# Patient Record
Sex: Male | Born: 1975 | Race: White | Hispanic: No | Marital: Married | Smoking: Never smoker
Health system: Southern US, Community
[De-identification: ages and names within clinical notes are randomized; demographics above are authoritative.]

---

## 2000-07-27 ENCOUNTER — Other Ambulatory Visit: Admission: RE | Admit: 2000-07-27 | Discharge: 2000-07-27 | Payer: Self-pay | Admitting: Specialist

## 2001-01-31 ENCOUNTER — Emergency Department (HOSPITAL_COMMUNITY): Admission: EM | Admit: 2001-01-31 | Discharge: 2001-01-31 | Payer: Self-pay | Admitting: Emergency Medicine

## 2001-01-31 ENCOUNTER — Encounter: Payer: Self-pay | Admitting: Emergency Medicine

## 2004-10-22 ENCOUNTER — Ambulatory Visit (HOSPITAL_COMMUNITY): Admission: RE | Admit: 2004-10-22 | Discharge: 2004-10-22 | Payer: Self-pay | Admitting: Family Medicine

## 2006-03-23 ENCOUNTER — Ambulatory Visit (HOSPITAL_COMMUNITY): Admission: RE | Admit: 2006-03-23 | Discharge: 2006-03-23 | Payer: Self-pay | Admitting: Specialist

## 2006-12-22 ENCOUNTER — Encounter: Admission: RE | Admit: 2006-12-22 | Discharge: 2006-12-22 | Payer: Self-pay | Admitting: Specialist

## 2008-03-03 IMAGING — CT CT 3D INDEPENDENT WKST
3 series · 15 of 27 positions shown, 18 images · non-contrast
Comparison: none

CLINICAL DATA: ?loose body
 3-DIMENSIONAL CT IMAGE RENDERING ON INDEPENDENT WORK STATION:
 3-dimensional CT images were rendered by post-processing of the original CT data on an independent work station.  The 3-dimensional CT images were interpreted, and findings were reported in the accompanying complete CT report for this study.

[Series 2: bone windows · axial · 0.39mm/px · z∈[-48,+42]mm · 5 of 109 slices shown, 7 images]
[im 19/109  soft-tissue]
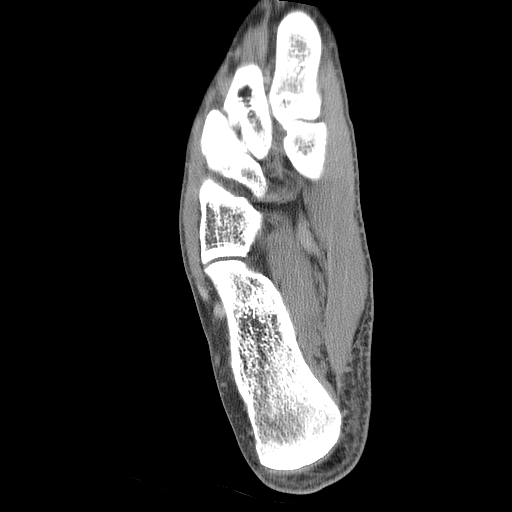
[im 19/109  bone]
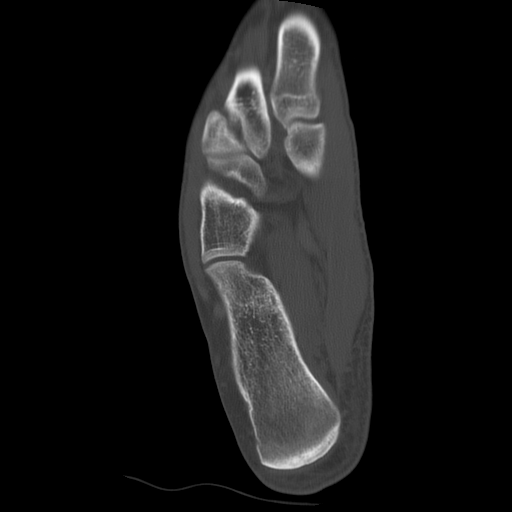
[im 37/109  bone]
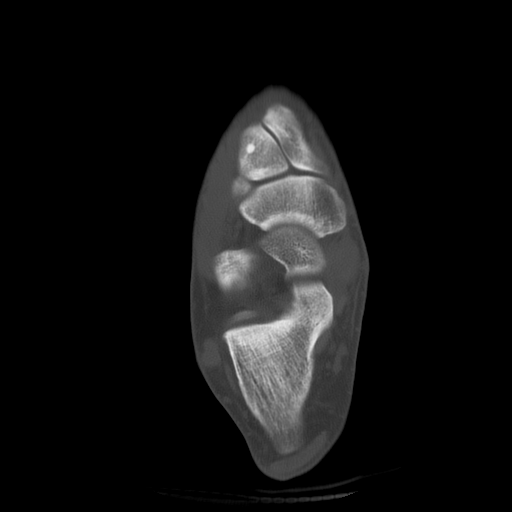
[im 55/109  bone]
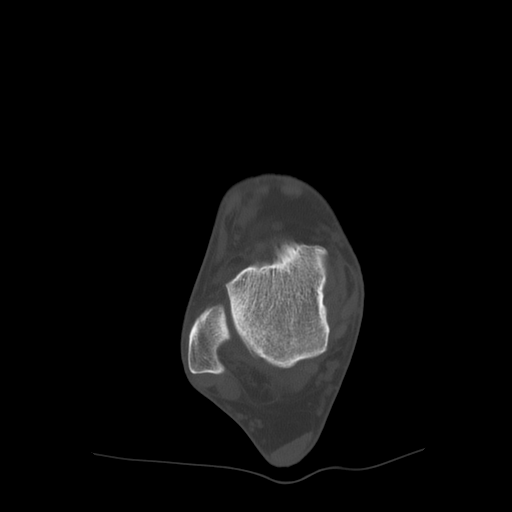
[im 73/109  bone]
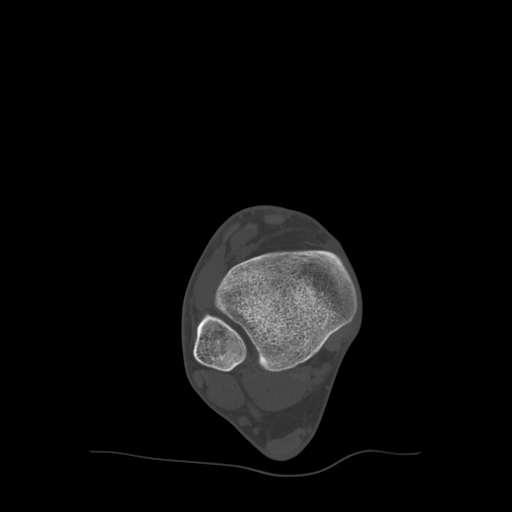
[im 91/109  soft-tissue]
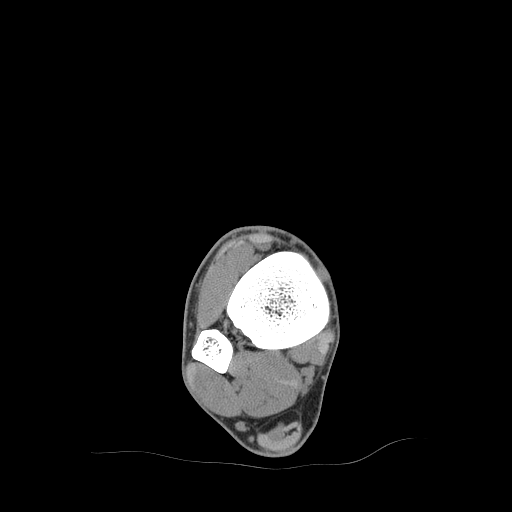
[im 91/109  bone]
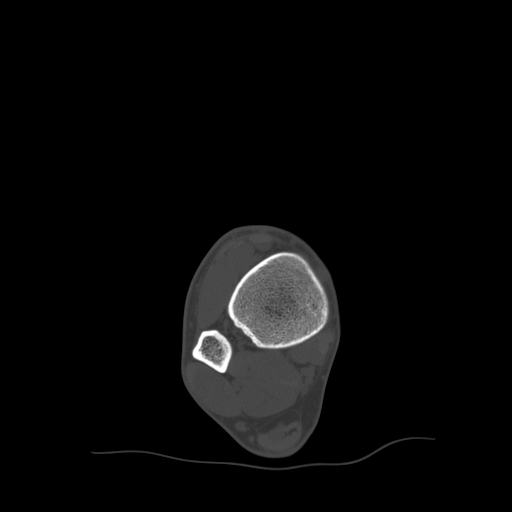

[Series 3: detail windows · axial · 0.39mm/px · z∈[-48,+42]mm · 5 of 109 slices shown]
[im 19/109  bone]
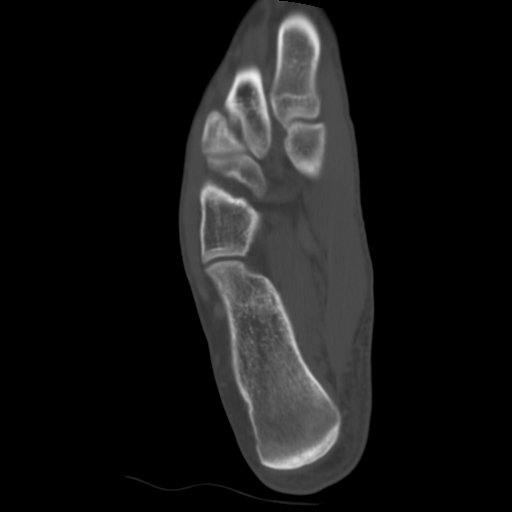
[im 37/109  bone]
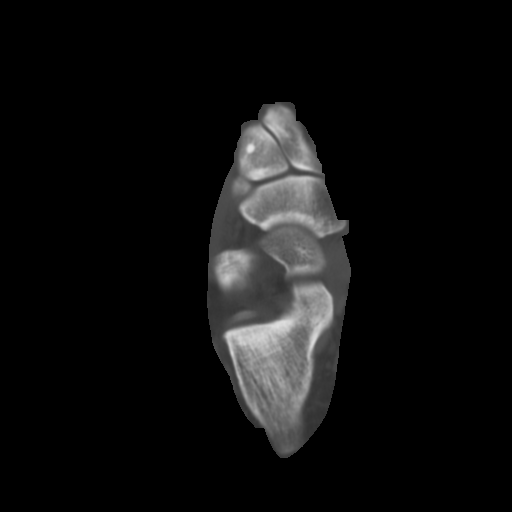
[im 55/109  bone]
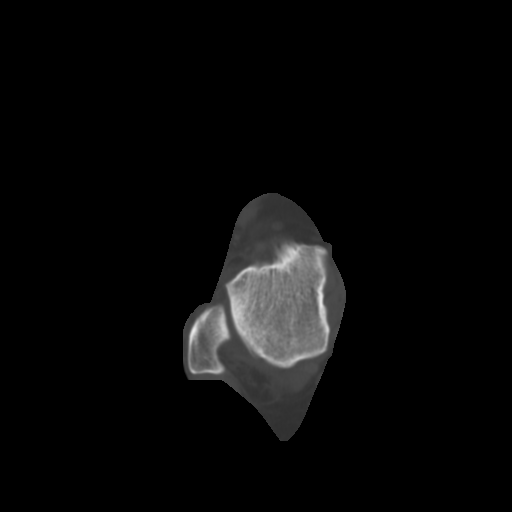
[im 73/109  bone]
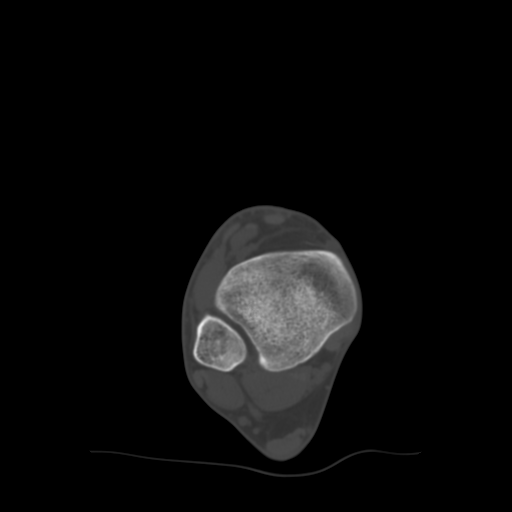
[im 91/109  bone]
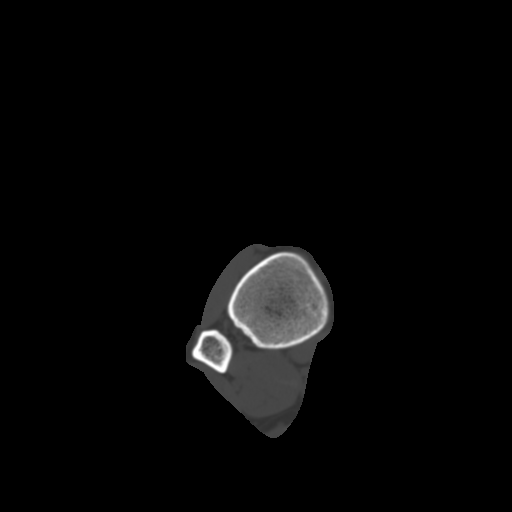

[Series 401: coronal · coronal · 0.39mm/px · 5 of 60 slices shown, 6 images]
[im 20/60  bone]
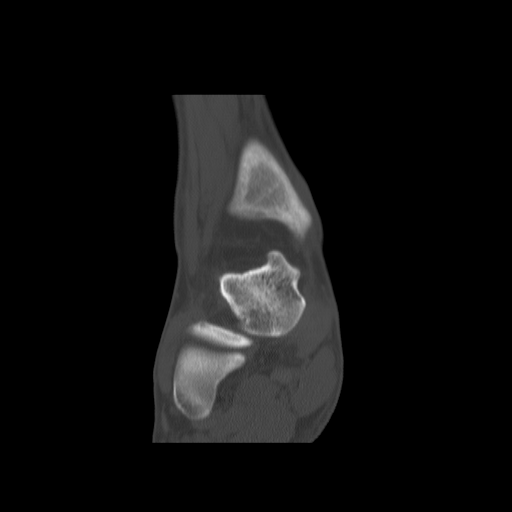
[im 25/60  bone]
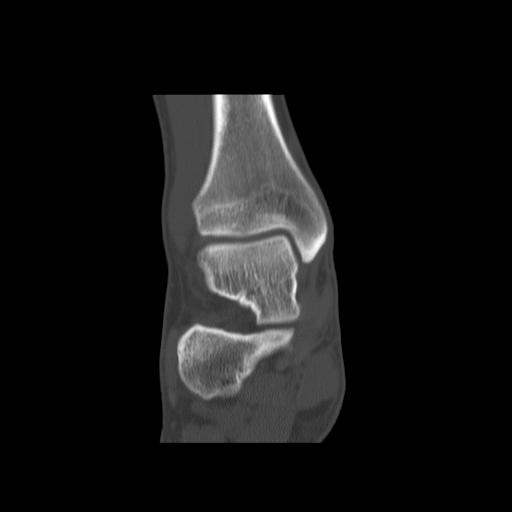
[im 30/60  soft-tissue]
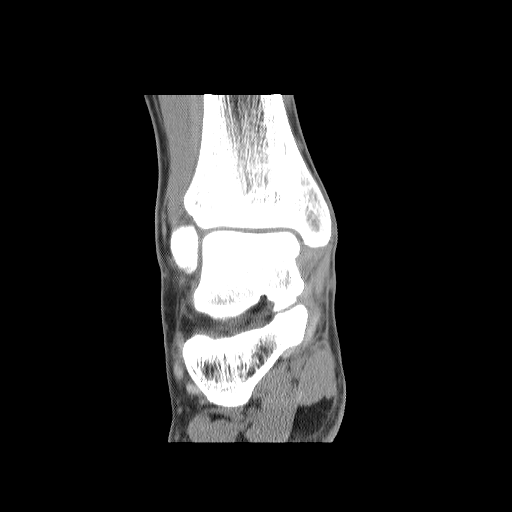
[im 30/60  bone]
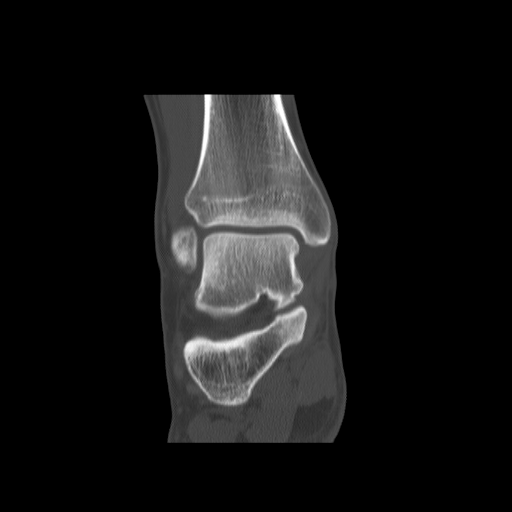
[im 35/60  bone]
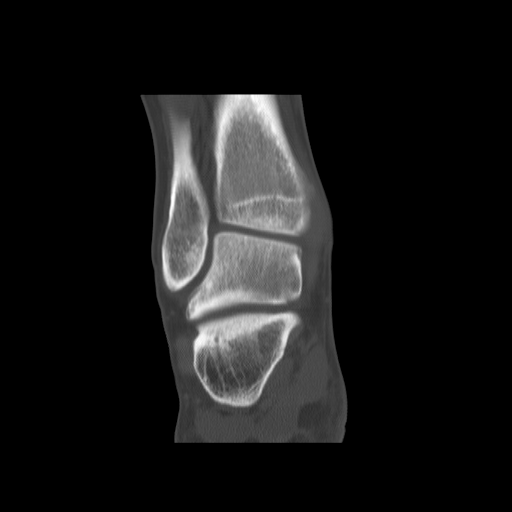
[im 40/60  bone]
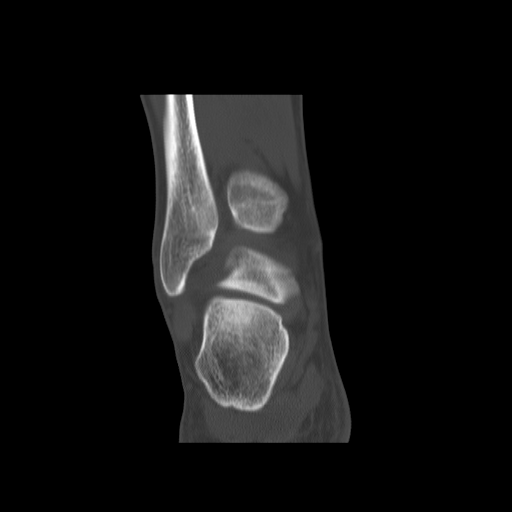

[15 of 27 positions shown; findings below may reference images not displayed]

## 2008-12-17 ENCOUNTER — Ambulatory Visit (HOSPITAL_COMMUNITY): Admission: RE | Admit: 2008-12-17 | Discharge: 2008-12-17 | Payer: Self-pay | Admitting: Orthopedic Surgery

## 2009-08-10 ENCOUNTER — Observation Stay (HOSPITAL_COMMUNITY): Admission: EM | Admit: 2009-08-10 | Discharge: 2009-08-11 | Payer: Self-pay | Admitting: Orthopedic Surgery

## 2016-07-02 ENCOUNTER — Ambulatory Visit (INDEPENDENT_AMBULATORY_CARE_PROVIDER_SITE_OTHER): Payer: BC Managed Care – PPO | Admitting: Family Medicine

## 2016-07-02 ENCOUNTER — Encounter: Payer: Self-pay | Admitting: Family Medicine

## 2016-07-02 VITALS — BP 120/80 | Temp 98.3°F | Ht 73.0 in | Wt 207.1 lb

## 2016-07-02 DIAGNOSIS — R42 Dizziness and giddiness: Secondary | ICD-10-CM

## 2016-07-02 DIAGNOSIS — J329 Chronic sinusitis, unspecified: Secondary | ICD-10-CM | POA: Diagnosis not present

## 2016-07-02 MED ORDER — CEPHALEXIN 500 MG PO CAPS
500.0000 mg | ORAL_CAPSULE | Freq: Three times a day (TID) | ORAL | 0 refills | Status: DC
Start: 2016-07-02 — End: 2016-10-26

## 2016-07-02 MED ORDER — MECLIZINE HCL 25 MG PO TABS: 25.0000 mg | ORAL_TABLET | Freq: Three times a day (TID) | ORAL | 0 refills | Status: DC | PRN

## 2016-07-02 NOTE — Progress Notes (Signed)
   Subjective:    Patient ID: Cory PickettWilliam E Howden, male    DOB: [birth year], 41 y.o.   MRN: [id number]  Dizziness  This is a new problem. The current episode started in the past 7 days. The problem occurs intermittently. The problem has been unchanged. Nothing aggravates the symptoms. He has tried nothing for the symptoms. The treatment provided no relief.   Patient has no other concerns at this time.    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       Objective:   Physical Exam Alert active mild nasal congestion HEENT normal facial neck supple cerebellar function perfect lungs clear heart regular in rhythm. Dizziness transiently reproduce with head and neck motion       Assessment & Plan:  Impression vertigo/in her ear dysfunction clinically initiated when odd head position at work. Also with recent upper rest for congestion will cover for potential sinusitis component plan Antivert 25 3 times a day 10 days antibiotics prescribed. Symptom care discussed if persists may need further workup and/or intervention such as Apley's maneuver training discussed

## 2016-07-10 ENCOUNTER — Ambulatory Visit (INDEPENDENT_AMBULATORY_CARE_PROVIDER_SITE_OTHER): Payer: BC Managed Care – PPO | Admitting: Family Medicine

## 2016-07-10 ENCOUNTER — Encounter: Payer: Self-pay | Admitting: Family Medicine

## 2016-07-10 VITALS — BP 118/76 | Ht 73.0 in | Wt 206.1 lb

## 2016-07-10 DIAGNOSIS — K2970 Gastritis, unspecified, without bleeding: Secondary | ICD-10-CM

## 2016-07-10 DIAGNOSIS — K209 Esophagitis, unspecified without bleeding: Secondary | ICD-10-CM

## 2016-07-10 MED ORDER — PANTOPRAZOLE SODIUM 40 MG PO TBEC: 40.0000 mg | DELAYED_RELEASE_TABLET | Freq: Every day | ORAL | 1 refills | Status: DC

## 2016-07-10 MED ORDER — SUCRALFATE 1 G PO TABS: ORAL_TABLET | ORAL | 0 refills | Status: DC

## 2016-07-10 NOTE — Progress Notes (Signed)
   Subjective:    Patient ID: Cory PickettWilliam E Brege, male    DOB: July 30, 1975, 41 y.o.   MRN: [id number]  HPI Patient in today with c/o of pain in chest after swallowing food. Also has c/o hiccups.   Wed ate lunch  Feels like a knife in the chest  Got to hurting and then went away  Had b fast   yest painful  Burping a lot  hiccuping frequently  No heartburn  Some coffee one or two cups  No nicotine  Rare use anti inflam meds  Alcohol intake , rare beer every couple weeks four times per wk, not heavy, ocas incr in holdiays hits very hard transienty in mid chet then calms down,  notmal bms   States no other concerns this visit.   Review of Systems No headache, no major weight loss or weight gain, no chest pain no back pain abdominal pain no change in bowel habits complete ROS otherwise negative     Objective:   Physical Exam Alert vitals stable, NAD. Blood pressure good on repeat. HEENT normal. Lungs clear. Heart regular rate and rhythm., Carotid pulses normal. No murmurs Abdomen mild to moderate epigastric tenderness. No rebound no guarding no organomegaly bowel sounds within normal limits       Assessment & Plan:  Impression #1 gastritis/esophagitis with an element of food causing distal transient esophageal spasm of just a few seconds, near dysphasia though not true dysphasia yet discussed with patient plan H. pylori. Initiate protonic. Add Carafate before meals to cover any element of esophagitis. Cut back on spicy foods caffeine. Warning signs discussed. If this resolves symptom wise no need for follow-up of persists call back for further interventions discussed

## 2016-07-10 NOTE — Patient Instructions (Signed)
Esophagitis °Esophagitis is inflammation of the esophagus. The esophagus is the tube that carries food and liquids from your mouth to your stomach. Esophagitis can cause soreness or pain in the esophagus. This condition can make it difficult and painful to swallow. °What are the causes? °Most causes of esophagitis are not serious. Common causes of this condition include: °· Gastroesophageal reflux disease (GERD). This is when stomach contents move back up into the esophagus (reflux). °· Repeated vomiting. °· An allergic-type reaction, especially caused by food allergies (eosinophilic esophagitis). °· Injury to the esophagus by swallowing large pills with or without water, or swallowing certain types of medicines. °· Swallowing (ingesting) harmful chemicals, such as household cleaning products. °· Heavy alcohol use. °· An infection of the esophagus. This most often occurs in people who have a weakened immune system. °· Radiation or chemotherapy treatment for cancer. °· Certain diseases such as sarcoidosis, Crohn disease, and scleroderma. °What are the signs or symptoms? °Symptoms of this condition include: °· Difficult or painful swallowing. °· Pain with swallowing acidic liquids, such as citrus juices. °· Pain with burping. °· Chest pain. °· Difficulty breathing. °· Nausea. °· Vomiting. °· Pain in the abdomen. °· Weight loss. °· Ulcers in the mouth. °· Patches of white material in the mouth (candidiasis). °· Fever. °· Coughing up blood or vomiting blood. °· Stool that is black, tarry, or bright red. °How is this diagnosed? °Your health care provider will take a medical history and perform a physical exam. You may also have other tests, including: °· An endoscopy to examine your stomach and esophagus with a small camera. °· A test that measures the acidity level in your esophagus. °· A test that measures how much pressure is on your esophagus. °· A barium swallow or modified barium swallow to show the shape, size,  and functioning of your esophagus. °· Allergy tests. °How is this treated? °Treatment for this condition depends on the cause of your esophagitis. In some cases, steroids or other medicines may be given to help relieve your symptoms or to treat the underlying cause of your condition. You may have to make some lifestyle changes, such as: °· Avoiding alcohol. °· Quitting smoking. °· Changing your diet. °· Exercising. °· Changing your sleep habits and your sleep environment. °Follow these instructions at home: °Take these actions to decrease your discomfort and to help avoid complications. °Diet  °· Follow a diet as recommended by your health care provider. This may involve avoiding foods and drinks such as: °¨ Coffee and tea (with or without caffeine). °¨ Drinks that contain alcohol. °¨ Energy drinks and sports drinks. °¨ Carbonated drinks or sodas. °¨ Chocolate and cocoa. °¨ Peppermint and mint flavorings. °¨ Garlic and onions. °¨ Horseradish. °¨ Spicy and acidic foods, including peppers, chili powder, curry powder, vinegar, hot sauces, and barbecue sauce. °¨ Citrus fruit juices and citrus fruits, such as oranges, lemons, and limes. °¨ Tomato-based foods, such as red sauce, chili, salsa, and pizza with red sauce. °¨ Fried and fatty foods, such as donuts, french fries, potato chips, and high-fat dressings. °¨ High-fat meats, such as hot dogs and fatty cuts of red and white meats, such as rib eye steak, sausage, ham, and bacon. °¨ High-fat dairy items, such as whole milk, butter, and cream cheese. °· Eat small, frequent meals instead of large meals. °· Avoid drinking large amounts of liquid with your meals. °· Avoid eating meals during the 2-3 hours before bedtime. °· Avoid lying down right after you eat. °·   Do not exercise right after you eat. °· Avoid foods and drinks that seem to make your symptoms worse. °General instructions  °· Pay attention to any changes in your symptoms. °· Take over-the-counter and  prescription medicines only as told by your health care provider. Do not take aspirin, ibuprofen, or other NSAIDs unless your health care provider told you to do so. °· If you have trouble taking pills, use a pill splitter to decrease the size of the pill. This will decrease the chance of the pill getting stuck or injuring your esophagus on the way down. Also, drink water after you take a pill. °· Do not use any tobacco products, including cigarettes, chewing tobacco, and e-cigarettes. If you need help quitting, ask your health care provider. °· Wear loose-fitting clothing. Do not wear anything tight around your waist that causes pressure on your abdomen. °· Raise (elevate) the head of your bed about 6 inches (15 cm). °· Try to reduce your stress, such as with yoga or meditation. If you need help reducing stress, ask your health care provider. °· If you are overweight, reduce your weight to an amount that is healthy for you. Ask your health care provider for guidance about a safe weight loss goal. °· Keep all follow-up visits as told by your health care provider. This is important. °Contact a health care provider if: °· You have new symptoms. °· You have unexplained weight loss. °· You have difficulty swallowing, or it hurts to swallow. °· You have wheezing or a persistent cough. °· Your symptoms do not improve with treatment. °· You have frequent heartburn for more than two weeks. °Get help right away if: °· You have severe pain in your arms, neck, jaw, teeth, or back. °· You feel sweaty, dizzy, or light-headed. °· You have chest pain or shortness of breath. °· You vomit and your vomit looks like blood or coffee grounds. °· Your stool is bloody or black. °· You have a fever. °· You cannot swallow, drink, or eat. °This information is not intended to replace advice given to you by your health care provider. Make sure you discuss any questions you have with your health care provider. °Document Released: 06/04/2004  Document Revised: 10/03/2015 Document Reviewed: 08/22/2014 °Elsevier Interactive Patient Education © 2017 [employer] ° °

## 2016-07-14 LAB — H PYLORI, IGM, IGG, IGA AB: H. pylori, IgG AbS: <0.8 Index Value (ref 0.00–0.79)

## 2016-10-26 ENCOUNTER — Encounter: Payer: BC Managed Care – PPO | Admitting: Family Medicine

## 2016-10-26 ENCOUNTER — Ambulatory Visit (INDEPENDENT_AMBULATORY_CARE_PROVIDER_SITE_OTHER): Payer: BC Managed Care – PPO | Admitting: Family Medicine

## 2016-10-26 ENCOUNTER — Encounter: Payer: Self-pay | Admitting: Family Medicine

## 2016-10-26 VITALS — BP 114/76 | HR 86 | Ht 72.0 in | Wt 196.0 lb

## 2016-10-26 DIAGNOSIS — Z0000 Encounter for general adult medical examination without abnormal findings: Secondary | ICD-10-CM

## 2016-10-26 NOTE — Progress Notes (Signed)
   Subjective:    Patient ID: Cory Arnold, male    DOB: March 23, 1976, 41 y.o.   MRN: [id number]  HPI The patient comes in today for a wellness visit.     A review of their health history was completed.  A review of medications was also completed.  Any needed refills; N/A   Eating habits: Patient states eating habits are good. Makes healthy choices.   Falls/  MVA accidents in past few months: None   Regular exercise: Patient coaches baseball.   Specialist pt sees on regular basis: None  Preventative health issues were discussed.   Additional concerns: Patient states no concerns this visit.   Had gastritis, had mild flare and tre took meds, and helped   vertigo has calmed down     Review of Systems  Constitutional: Negative for activity change, appetite change and fever.  HENT: Negative for congestion and rhinorrhea.   Eyes: Negative for discharge.  Respiratory: Negative for cough and wheezing.   Cardiovascular: Negative for chest pain.  Gastrointestinal: Negative for abdominal pain, blood in stool and vomiting.  Genitourinary: Negative for difficulty urinating and frequency.  Musculoskeletal: Negative for neck pain.  Skin: Negative for rash.  Allergic/Immunologic: Negative for environmental allergies and food allergies.  Neurological: Negative for weakness and headaches.  Psychiatric/Behavioral: Negative for agitation.  All other systems reviewed and are negative.      Objective:   Physical Exam  Constitutional: He appears well-developed and well-nourished.  HENT:  Head: Normocephalic and atraumatic.  Right Ear: External ear normal.  Left Ear: External ear normal.  Nose: Nose normal.  Mouth/Throat: Oropharynx is clear and moist.  Eyes: EOM are normal. Pupils are equal, round, and reactive to light.  Neck: Normal range of motion. Neck supple. No thyromegaly present.  Cardiovascular: Normal rate, regular rhythm and normal heart sounds.   No murmur  heard. Pulmonary/Chest: Effort normal and breath sounds normal. No respiratory distress. He has no wheezes.  Abdominal: Soft. Bowel sounds are normal. He exhibits no distension and no mass. There is no tenderness.  Genitourinary: Penis normal.  Musculoskeletal: Normal range of motion. He exhibits no edema.  Lymphadenopathy:    He has no cervical adenopathy.  Neurological: He is alert. He exhibits normal muscle tone.  Skin: Skin is warm and dry. No erythema.  Psychiatric: He has a normal mood and affect. His behavior is normal. Judgment normal.  Vitals reviewed.         Assessment & Plan:  Impression well adult exam doing great diet exercise discussed. Plan form filled out appropriate blood work further recommendations based results

## 2017-03-01 ENCOUNTER — Encounter: Payer: Self-pay | Admitting: Family Medicine

## 2017-03-01 ENCOUNTER — Ambulatory Visit (INDEPENDENT_AMBULATORY_CARE_PROVIDER_SITE_OTHER): Payer: BC Managed Care – PPO | Admitting: Family Medicine

## 2017-03-01 VITALS — BP 112/74 | Ht 72.0 in | Wt 207.0 lb

## 2017-03-01 DIAGNOSIS — S161XXA Strain of muscle, fascia and tendon at neck level, initial encounter: Secondary | ICD-10-CM | POA: Diagnosis not present

## 2017-03-01 MED ORDER — CHLORZOXAZONE 500 MG PO TABS: ORAL_TABLET | ORAL | 1 refills | Status: DC

## 2017-03-01 MED ORDER — NABUMETONE 750 MG PO TABS: 750.0000 mg | ORAL_TABLET | Freq: Two times a day (BID) | ORAL | 1 refills | Status: DC | PRN

## 2017-03-01 NOTE — Progress Notes (Signed)
   Subjective:    Patient ID: Cory PickettWilliam E Arnold, male    DOB: [birth year], 41 y.o.   MRN: [id number]  HPINeck and right shoulder pain. Started 3 days ago. Tried tylenol. Some relief.   Declines flu vaccine.   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 Blood pressure good on repeat. HEENT normal. Lungs clear. Heart regular rate and rhythm. Positive parascapular tenderness soreness to palpation positive pain with rotation of neck arms strength intact reflexes intact    impression paracervical strain/spasm plan anti-inflammatory medicine prescribed symptom care discussed local measures discussed      Assessment & Plan:

## 2017-03-05 ENCOUNTER — Telehealth: Payer: Self-pay | Admitting: Family Medicine

## 2017-03-05 DIAGNOSIS — M542 Cervicalgia: Secondary | ICD-10-CM

## 2017-03-05 MED ORDER — PREDNISONE 20 MG PO TABS: ORAL_TABLET | ORAL | 0 refills | Status: DC

## 2017-03-05 NOTE — Telephone Encounter (Signed)
I would recommend a short course of prednisone 20 mg tablets, 3 tablets daily for 2 days then 2 tablets daily for 2 days then 1 tablet daily for 2 days hold off on Relafen, may use Tylenol with the prednisone, also recommend physical therapy consult plus also a follow-up office visit with Dr. Brett CanalesSteve if not doing better by midweek

## 2017-03-05 NOTE — Telephone Encounter (Signed)
Patient advised doctor would recommend a short course of prednisone 20 mg tablets, 3 tablets daily for 2 days then 2 tablets daily for 2 days then 1 tablet daily for 2 days hold off on Relafen, may use Tylenol with the prednisone, also recommend physical therapy consult plus also a follow-up office visit with Dr. Brett CanalesSteve if not doing better by midweek. Patient verbalized understanding . Prescription sent electronically to pharmacy.  [facility]

## 2017-03-05 NOTE — Telephone Encounter (Signed)
Patient seen Dr. Brett CanalesSteve on 03/01/17 for neck strain.  He said his neck is not getting any better and would like to know what is recommended?

## 2017-03-05 NOTE — Telephone Encounter (Signed)
Was seen 03/01/17 for neck strain and given chlorzoxazone and relafen

## 2017-03-10 ENCOUNTER — Encounter: Payer: Self-pay | Admitting: Family Medicine

## 2017-03-18 ENCOUNTER — Other Ambulatory Visit: Payer: Self-pay | Admitting: Family Medicine

## 2017-03-18 ENCOUNTER — Telehealth: Payer: Self-pay | Admitting: Family Medicine

## 2017-03-18 MED ORDER — DOXYCYCLINE HYCLATE 100 MG PO TABS: 100.0000 mg | ORAL_TABLET | Freq: Two times a day (BID) | ORAL | 0 refills | Status: DC

## 2017-03-18 NOTE — Telephone Encounter (Signed)
The patient showed me a tick bite that was on his left inside of his arm he is not having any fever chills sweats or unusual rash but the tick bite erythematous red around it with a central part that is a scab I recommended doxycycline 100 mg twice daily for 7 Days [address] to take it was discussed.  In addition to this patient was warned that if he starts having fever headaches body aches high fevers unusual rash he must be seen right away doxycycline was sent into his pharmacy

## 2017-03-18 NOTE — Progress Notes (Signed)
do

## 2017-09-09 ENCOUNTER — Ambulatory Visit: Payer: BC Managed Care – PPO | Admitting: Family Medicine

## 2017-09-09 ENCOUNTER — Encounter: Payer: Self-pay | Admitting: Family Medicine

## 2017-09-09 VITALS — BP 142/92 | Temp 98.5°F | Ht 72.0 in | Wt 205.4 lb

## 2017-09-09 DIAGNOSIS — J019 Acute sinusitis, unspecified: Secondary | ICD-10-CM

## 2017-09-09 MED ORDER — CEFPROZIL 500 MG PO TABS: 500.0000 mg | ORAL_TABLET | Freq: Two times a day (BID) | ORAL | 0 refills | Status: DC

## 2017-09-09 NOTE — Progress Notes (Signed)
   Subjective:    Patient ID: Cory Arnold, male    DOB: 1975-09-30, 42 y.o.   MRN: [id number]  Cough  This is a new problem. The current episode started in the past 7 days. Cough characteristics: green colored phelgm. Associated symptoms include headaches, rhinorrhea and a sore throat. Pertinent negatives include no chest pain, chills, ear pain, fever or wheezing. Associated symptoms comments: Cant sleep. Treatments tried: Ibuprofen, Sudafed. The treatment provided no relief.   Headache on Tuesday Started off with headache on Tuesday then progressed into this a little bit of body aches some sweats head congestion sinus pressure drainage sinus pain denies wheezing difficulty breathing Wed- resp sx     Review of Systems  Constitutional: Negative for activity change, chills and fever.  HENT: Positive for congestion, rhinorrhea and sore throat. Negative for ear pain.   Eyes: Negative for discharge.  Respiratory: Positive for cough. Negative for wheezing.   Cardiovascular: Negative for chest pain.  Gastrointestinal: Negative for abdominal pain, blood in stool, constipation, diarrhea, nausea and vomiting.  Musculoskeletal: Negative for arthralgias.  Neurological: Positive for headaches.       Objective:   Physical Exam  Constitutional: He appears well-developed.  HENT:  Head: Normocephalic.  Mouth/Throat: Oropharynx is clear and moist. No oropharyngeal exudate.  Neck: Normal range of motion.  Cardiovascular: Normal rate, regular rhythm and normal heart sounds.  No murmur heard. Pulmonary/Chest: Effort normal and breath sounds normal. He has no wheezes.  Lymphadenopathy:    He has no cervical adenopathy.  Neurological: He exhibits normal muscle tone.  Skin: Skin is warm and dry.  Nursing note and vitals reviewed.         Assessment & Plan:  Viral syndrome Secondary rhinosinusitis Patient was seen today for upper respiratory illness. It is felt that the patient is dealing  with sinusitis.  Antibiotics were prescribed today. Importance of compliance with medication was discussed.  Symptoms should gradually resolve over the course of the next several days. If high fevers, progressive illness, difficulty breathing, worsening condition or failure for symptoms to improve over the next several days then the patient is to follow-up.  If any emergent conditions the patient is to follow-up in the emergency department otherwise to follow-up in the office. Patient should gradually get better with medication

## 2017-09-13 ENCOUNTER — Telehealth: Payer: Self-pay | Admitting: Family Medicine

## 2017-09-13 NOTE — Telephone Encounter (Signed)
Patient was prescribed Cefzil on 09/09/17 and he wanted to let Dr. Lorin Picket know that he didn't have any side effects.  He doesn't feel like he is much better since seeing Dr. Lorin Picket.  He said he has a bad cough that is keeping him up at night.  He is requesting Rx called in for this.  [facility]

## 2017-09-13 NOTE — Telephone Encounter (Signed)
Patient called to check on this message.

## 2017-09-13 NOTE — Telephone Encounter (Signed)
Pt states that throat is still sore, feels like a knife sticking in throat, coughing gets worse in evening. Sweating at night which is not normal for pt. Please advise.

## 2017-09-14 ENCOUNTER — Other Ambulatory Visit: Payer: Self-pay | Admitting: Family Medicine

## 2017-09-14 MED ORDER — HYDROCODONE-HOMATROPINE 5-1.5 MG/5ML PO SYRP: ORAL_SOLUTION | ORAL | 0 refills | Status: DC

## 2017-09-14 MED ORDER — DOXYCYCLINE HYCLATE 100 MG PO TABS: 100.0000 mg | ORAL_TABLET | Freq: Two times a day (BID) | ORAL | 0 refills | Status: AC

## 2017-09-14 NOTE — Telephone Encounter (Signed)
Call pt, with alergies needs doxy 100 bid ten d, sun sensitizer so yuse protection, if pt would like prescription strength cough med at night hycodan 3 oz one tspn qhs prn cough

## 2017-09-14 NOTE — Telephone Encounter (Signed)
meds placed and sent to [facility]; pt notified

## 2017-10-18 ENCOUNTER — Ambulatory Visit: Payer: BC Managed Care – PPO | Admitting: Family Medicine

## 2017-10-18 ENCOUNTER — Encounter: Payer: Self-pay | Admitting: Family Medicine

## 2017-10-18 VITALS — BP 124/88 | Temp 98.8°F | Ht 72.0 in | Wt 206.0 lb

## 2017-10-18 DIAGNOSIS — J4521 Mild intermittent asthma with (acute) exacerbation: Secondary | ICD-10-CM | POA: Diagnosis not present

## 2017-10-18 MED ORDER — BENZONATATE 100 MG PO CAPS: ORAL_CAPSULE | ORAL | 1 refills | Status: DC

## 2017-10-18 MED ORDER — ALBUTEROL SULFATE HFA 108 (90 BASE) MCG/ACT IN AERS: 2.0000 | INHALATION_SPRAY | Freq: Four times a day (QID) | RESPIRATORY_TRACT | 2 refills | Status: DC | PRN

## 2017-10-18 MED ORDER — PREDNISONE 20 MG PO TABS: ORAL_TABLET | ORAL | 0 refills | Status: DC

## 2017-10-18 NOTE — Progress Notes (Signed)
   Subjective:    Patient ID: Larene PickettWilliam E Rosner, male    DOB: 08/28/1975, 42 y.o.   MRN: [id number]  HPI Patient is here today for a cough, sore throat and some sinus drainage. Did not sleep last night due to the cough. He states he has been on two antibx with this, and it is still lingering.   Pt has had persistet symotns  See prior notes.  Persistent cough.  Persistent wheeziness.  Persistent sore throat.  Coughing day and night.  Worse with exertion.  Worse at night.  Generally nonproductive.  Has been through couple rounds of antibiotics.  Got hit with an initial flulike prodrome at the start of May with this  Review of Systems AlertNo headache, no major weight loss or weight gain, no chest pain no back pain abdominal pain no change in bowel habits complete ROS otherwise negative     Objective:   Physical Exam  Active.  Hydration alert no acute distress.  HEENT mild nasal congestion.  Pharynx slight erythema neck supple.  Lungs bilateral mild wheeze with cough heart regular rate and 1      Assessment & Plan:  1 1 impression reactive airways post viral prodrome.  He has had multiple rounds of antibiotics.  Will try prednisone taper.  Also continue son's albuterol 2 sprays 4 times daily.  Also [employer] Perles as needed.  Symptom care discussed slow resolution discussed reactive airway

## 2018-01-17 ENCOUNTER — Telehealth: Payer: Self-pay

## 2018-01-17 NOTE — Telephone Encounter (Signed)
Patient called and states he is having some vertigo. Started on yesterday and has continued today.He has no other symptoms. I advised him that we had no available appointments today.Recommend urgent care. Pt states understanding.

## 2018-01-17 NOTE — Telephone Encounter (Signed)
I agree with the management 

## 2019-02-22 ENCOUNTER — Ambulatory Visit (INDEPENDENT_AMBULATORY_CARE_PROVIDER_SITE_OTHER): Payer: BC Managed Care – PPO | Admitting: Family Medicine

## 2019-02-22 ENCOUNTER — Other Ambulatory Visit: Payer: Self-pay

## 2019-02-22 ENCOUNTER — Other Ambulatory Visit (HOSPITAL_COMMUNITY)
Admission: RE | Admit: 2019-02-22 | Discharge: 2019-02-22 | Disposition: A | Discharge status: Home / Self Care | Payer: BC Managed Care – PPO | Source: Ambulatory Visit | Attending: Family Medicine | Admitting: Family Medicine

## 2019-02-22 ENCOUNTER — Encounter: Payer: Self-pay | Admitting: Family Medicine

## 2019-02-22 VITALS — BP 118/86 | Temp 98.3°F | Ht 72.0 in | Wt 203.0 lb

## 2019-02-22 DIAGNOSIS — R109 Unspecified abdominal pain: Secondary | ICD-10-CM

## 2019-02-22 LAB — CBC WITH DIFFERENTIAL/PLATELET
Abs Immature Granulocytes: 0.01 10*3/uL (ref 0.00–0.07)
Basophils Absolute: 0 10*3/uL (ref 0.0–0.1)
Basophils Relative: 0 %
Eosinophils Absolute: 0.1 10*3/uL (ref 0.0–0.5)
Eosinophils Relative: 1 %
HCT: 44.1 % (ref 39.0–52.0)
Hemoglobin: 14.7 g/dL (ref 13.0–17.0)
Immature Granulocytes: 0 %
Lymphocytes Relative: 26 %
Lymphs Abs: 1.8 10*3/uL (ref 0.7–4.0)
MCH: 30.5 pg (ref 26.0–34.0)
MCHC: 33.3 g/dL (ref 30.0–36.0)
MCV: 91.5 fL (ref 80.0–100.0)
Monocytes Absolute: 0.6 10*3/uL (ref 0.1–1.0)
Monocytes Relative: 9 %
Neutro Abs: 4.5 10*3/uL (ref 1.7–7.7)
Neutrophils Relative %: 64 %
Platelets: 218 10*3/uL (ref 150–400)
RBC: 4.82 MIL/uL (ref 4.22–5.81)
RDW: 12.2 % (ref 11.5–15.5)
WBC: 7 10*3/uL (ref 4.0–10.5)
nRBC: 0 % (ref 0.0–0.2)

## 2019-02-22 LAB — BASIC METABOLIC PANEL
Anion gap: 8 (ref 5–15)
BUN: 17 mg/dL (ref 6–20)
CO2: 28 mmol/L (ref 22–32)
Calcium: 9.4 mg/dL (ref 8.9–10.3)
Chloride: 104 mmol/L (ref 98–111)
Creatinine, Ser: 1.03 mg/dL (ref 0.61–1.24)
GFR calc Af Amer: >60 mL/min (ref >60)
GFR calc non Af Amer: >60 mL/min (ref >60)
Glucose, Bld: 96 mg/dL (ref 70–99)
Potassium: 4.3 mmol/L (ref 3.5–5.1)
Sodium: 140 mmol/L (ref 135–145)

## 2019-02-22 LAB — POCT URINALYSIS DIPSTICK
Spec Grav, UA: 1.015 (ref 1.010–1.025)
pH, UA: 5 (ref 5.0–8.0)

## 2019-02-22 LAB — HEPATIC FUNCTION PANEL
ALT: 20 U/L (ref 0–44)
AST: 21 U/L (ref 15–41)
Albumin: 4.5 g/dL (ref 3.5–5.0)
Alkaline Phosphatase: 61 U/L (ref 38–126)
Bilirubin, Direct: 0.1 mg/dL (ref 0.0–0.2)
Indirect Bilirubin: 0.1 mg/dL — ABNORMAL LOW (ref 0.3–0.9)
Total Bilirubin: 0.2 mg/dL — ABNORMAL LOW (ref 0.3–1.2)
Total Protein: 7.4 g/dL (ref 6.5–8.1)

## 2019-02-22 LAB — AMYLASE: Amylase: 50 U/L (ref 28–100)

## 2019-02-22 LAB — LIPASE, BLOOD: Lipase: 38 U/L (ref 11–51)

## 2019-02-22 NOTE — Progress Notes (Signed)
   Subjective:    Patient ID: Cory Arnold, male    DOB: 1975-05-26, 43 y.o.   MRN: [id number]  Abdominal Pain This is a new problem. The current episode started yesterday. The pain is located in the RLQ.    yest noted abd pain  Working at [employer] right lower quad pain  Noted took tums never completely wenty away   Ate a avocado  Nagging dull pain   Not worse with movement    Patient notes fairly severe pain.  Right lower quadrant.  Still has his appendix.  No nausea no vomiting.  No fever no chills.  No dysuria.  No change in bowel habits.  Pain comes and goes.  Slept okay last night.  Due to go on a camping trip and wanting to make sure his abdomen is okay  Review of Systems  Gastrointestinal: Positive for abdominal pain.  No headache, no major weight loss or weight gain, no chest pain no back pain abdominal pain no change in bowel habits complete ROS otherwise negative      Objective:   Physical Exam  Alert and oriented, vitals reviewed and stable, NAD ENT-TM's and ext canals WNL bilat via otoscopic exam Soft palate, tonsils and post pharynx WNL via oropharyngeal exam Neck-symmetric, no masses; thyroid nonpalpable and nontender Pulmonary-no tachypnea or accessory muscle use; Clear without wheezes via auscultation Card--no abnrml murmurs, rhythm reg and rate WNL Carotid pulses symmetric, without bruits Right lower quadrant moderate tenderness to deep palpation no rebound no guarding no major distress bowel sounds excellent no organomegaly      Assessment & Plan:  Impression moderately severe abdominal pain.  Right lower quadrant.  May just be an abdominal strain.  Could be something more serious internally.  Rationale discussed.  Stat blood work done.  Blood work returned reassuring symptom care recommended warning signs discussed questions answered  Greater than 50% of this 25 minute face to face visit was spent in counseling and discussion and  coordination of care regarding the above diagnosis/diagnosies

## 2019-03-16 ENCOUNTER — Other Ambulatory Visit: Payer: Self-pay

## 2019-03-16 ENCOUNTER — Encounter: Payer: Self-pay | Admitting: Family Medicine

## 2019-03-16 ENCOUNTER — Ambulatory Visit (INDEPENDENT_AMBULATORY_CARE_PROVIDER_SITE_OTHER): Payer: BC Managed Care – PPO | Admitting: Family Medicine

## 2019-03-16 VITALS — BP 122/84 | Temp 97.6°F | Ht 72.0 in | Wt 209.0 lb

## 2019-03-16 DIAGNOSIS — M79672 Pain in left foot: Secondary | ICD-10-CM

## 2019-03-16 NOTE — Progress Notes (Signed)
   Subjective:    Patient ID: Cory Arnold, male    DOB: 1976/01/13, 43 y.o.   MRN: [id number]  HPIleft great toe pain for 2 weeks. Pt thinks he has gout. Redness in top of foot and a little swelling. No injury.   No history of gout in the past.  Has been doing quite a bit of work in his boots.  Outdoors hiking a lot.  Recalls no sting or bite or injury  Notes redness and swelling dorsum of left foot.  Fairly sensitive at times.  Take an occasional anti-inflammatory  Review of Systems No headache, no major weight loss or weight gain, no chest pain no back pain abdominal pain no change in bowel habits complete ROS otherwise negative     Objective:   Physical Exam  Alert vitals stable, NAD. Blood pressure good on repeat. HEENT normal. Lungs clear. Heart regular rate and rhythm. Reveals erythematous tender slightly swollen patch across the dorsum of the metatarsals      Assessment & Plan:  Impression inflammatory tenosynovitis versus cellulitis.  Discussed.  Will give trial of anti-inflammatories.  If persists or worsen will add Keflex 500 3 times daily 10 days symptom care discussed

## 2019-06-12 ENCOUNTER — Encounter: Payer: Self-pay | Admitting: Family Medicine

## 2019-08-25 ENCOUNTER — Other Ambulatory Visit: Payer: Self-pay

## 2019-08-25 ENCOUNTER — Encounter: Payer: Self-pay | Admitting: Family Medicine

## 2019-08-25 ENCOUNTER — Ambulatory Visit (INDEPENDENT_AMBULATORY_CARE_PROVIDER_SITE_OTHER): Payer: BC Managed Care – PPO | Admitting: Family Medicine

## 2019-08-25 VITALS — BP 128/82 | Temp 97.4°F | Ht 72.0 in | Wt 210.0 lb

## 2019-08-25 DIAGNOSIS — Z79899 Other long term (current) drug therapy: Secondary | ICD-10-CM

## 2019-08-25 DIAGNOSIS — Z0000 Encounter for general adult medical examination without abnormal findings: Secondary | ICD-10-CM | POA: Diagnosis not present

## 2019-08-25 NOTE — Progress Notes (Signed)
   Subjective:    Patient ID: Cory Arnold, male    DOB: 04-May-1976, 44 y.o.   MRN: [id number]  HPI The patient comes in today for a wellness visit.    A review of their health history was completed.  A review of medications was also completed.  Any needed refills; no medications  Eating habits: eats pretty good  Falls/  MVA accidents in past few months: none  Regular exercise: walk several miles a day   Specialist pt sees on regular basis: none  Preventative health issues were discussed.   Additional concerns:none  Stays physically active   workd  At home and at work   Two t three beers per eve  occas dip  Not sutck on       Review of Systems  Constitutional: Negative for activity change, appetite change and fever.  HENT: Negative for congestion and rhinorrhea.   Eyes: Negative for discharge.  Respiratory: Negative for cough and wheezing.   Cardiovascular: Negative for chest pain.  Gastrointestinal: Negative for abdominal pain, blood in stool and vomiting.  Genitourinary: Negative for difficulty urinating and frequency.  Musculoskeletal: Negative for neck pain.  Skin: Negative for rash.  Allergic/Immunologic: Negative for environmental allergies and food allergies.  Neurological: Negative for weakness and headaches.  Psychiatric/Behavioral: Negative for agitation.  All other systems reviewed and are negative.      Objective:   Physical Exam Vitals reviewed.  Constitutional:      Appearance: He is well-developed.  HENT:     Head: Normocephalic and atraumatic.     Right Ear: External ear normal.     Left Ear: External ear normal.     Nose: Nose normal.  Eyes:     Pupils: Pupils are equal, round, and reactive to light.  Neck:     Thyroid: No thyromegaly.  Cardiovascular:     Rate and Rhythm: Normal rate and regular rhythm.     Heart sounds: Normal heart sounds. No murmur.  Pulmonary:     Effort: Pulmonary effort is normal. No respiratory  distress.     Breath sounds: Normal breath sounds. No wheezing.  Abdominal:     General: Bowel sounds are normal. There is no distension.     Palpations: Abdomen is soft. There is no mass.     Tenderness: There is no abdominal tenderness.  Genitourinary:    Penis: Normal.   Musculoskeletal:        General: Normal range of motion.     Cervical back: Normal range of motion and neck supple.  Lymphadenopathy:     Cervical: No cervical adenopathy.  Skin:    General: Skin is warm and dry.     Findings: No erythema.  Neurological:     Mental Status: He is alert.     Motor: No abnormal muscle tone.  Psychiatric:        Behavior: Behavior normal.        Judgment: Judgment normal.           Assessment & Plan:  Impression well adult exam.  Diet discussed.  Exercise discussed.  Patient encouraged to not exceed 2 beers per day.  Blood work discussed and ordered.  Patient is quite physically active.

## 2019-08-30 LAB — HEPATIC FUNCTION PANEL
ALT: 22 IU/L (ref 0–44)
AST: 21 IU/L (ref 0–40)
Albumin: 4.9 g/dL (ref 4.0–5.0)
Alkaline Phosphatase: 72 IU/L (ref 39–117)
Bilirubin Total: 0.2 mg/dL (ref 0.0–1.2)
Bilirubin, Direct: 0.09 mg/dL (ref 0.00–0.40)
Total Protein: 7 g/dL (ref 6.0–8.5)

## 2019-08-30 LAB — BASIC METABOLIC PANEL
BUN/Creatinine Ratio: 16 (ref 9–20)
BUN: 16 mg/dL (ref 6–24)
CO2: 22 mmol/L (ref 20–29)
Calcium: 9.5 mg/dL (ref 8.7–10.2)
Chloride: 102 mmol/L (ref 96–106)
Creatinine, Ser: 0.97 mg/dL (ref 0.76–1.27)
GFR calc Af Amer: 109 mL/min/{1.73_m2} (ref >59)
GFR calc non Af Amer: 94 mL/min/{1.73_m2} (ref >59)
Glucose: 90 mg/dL (ref 65–99)
Potassium: 4.8 mmol/L (ref 3.5–5.2)
Sodium: 141 mmol/L (ref 134–144)

## 2019-08-30 LAB — LIPID PANEL
Chol/HDL Ratio: 3 ratio (ref 0.0–5.0)
Cholesterol, Total: 158 mg/dL (ref 100–199)
HDL: 53 mg/dL (ref >39)
LDL Chol Calc (NIH): 90 mg/dL (ref 0–99)
Triglycerides: 81 mg/dL (ref 0–149)
VLDL Cholesterol Cal: 15 mg/dL (ref 5–40)

## 2019-09-02 ENCOUNTER — Encounter: Payer: Self-pay | Admitting: Family Medicine

## 2020-05-24 ENCOUNTER — Other Ambulatory Visit: Payer: Self-pay

## 2020-05-24 ENCOUNTER — Encounter: Payer: Self-pay | Admitting: Family Medicine

## 2020-05-24 ENCOUNTER — Ambulatory Visit (INDEPENDENT_AMBULATORY_CARE_PROVIDER_SITE_OTHER): Payer: BC Managed Care – PPO | Admitting: Family Medicine

## 2020-05-24 VITALS — HR 99 | Temp 97.6°F | Resp 16

## 2020-05-24 DIAGNOSIS — H66001 Acute suppurative otitis media without spontaneous rupture of ear drum, right ear: Secondary | ICD-10-CM

## 2020-05-24 DIAGNOSIS — R059 Cough, unspecified: Secondary | ICD-10-CM

## 2020-05-24 DIAGNOSIS — J40 Bronchitis, not specified as acute or chronic: Secondary | ICD-10-CM | POA: Diagnosis not present

## 2020-05-24 MED ORDER — DOXYCYCLINE HYCLATE 100 MG PO TABS: 100.0000 mg | ORAL_TABLET | Freq: Two times a day (BID) | ORAL | 0 refills | Status: DC

## 2020-05-24 MED ORDER — BENZONATATE 100 MG PO CAPS: 100.0000 mg | ORAL_CAPSULE | Freq: Two times a day (BID) | ORAL | 0 refills | Status: DC | PRN

## 2020-05-24 MED ORDER — PREDNISONE 20 MG PO TABS: 20.0000 mg | ORAL_TABLET | Freq: Every day | ORAL | 0 refills | Status: DC

## 2020-05-24 NOTE — Progress Notes (Signed)
Patient ID: Cory Arnold, male    DOB: August 05, 1975, 45 y.o.   MRN: [id number]   Chief Complaint  Patient presents with  . Cough    Patient reports sinus issues since 12/28. Complaints of cough, congestion and drainage. Has tried sudafed.  Negative covid test on 12/31.   Subjective:  CC: cough, congestion, sinus drainage  This is a new problem.  Presents today for an acute visit with a complaint of cough, congestion and sinus drainage.  Associated symptoms include headache that comes with the sinus pressure and cough, congestion.  Pertinent negatives include no fever, no chills, no sore throat.  Symptom onset December 28, has tried Sudafed and DayQuil.  Last COVID testing took which was negative was on December 30.  We will retest for COVID today.    Medical History Cory Arnold has no past medical history on file.   Outpatient Encounter Medications as of 05/24/2020  Medication Sig  . benzonatate (TESSALON) 100 MG capsule Take 1 capsule (100 mg total) by mouth 2 (two) times daily as needed for cough.  . doxycycline (VIBRA-TABS) 100 MG tablet Take 1 tablet (100 mg total) by mouth 2 (two) times daily.  . predniSONE (DELTASONE) 20 MG tablet Take 1 tablet (20 mg total) by mouth daily with breakfast.   No facility-administered encounter medications on file as of 05/24/2020.     Review of Systems  Constitutional: Positive for fatigue. Negative for chills and fever.  HENT: Positive for congestion. Negative for ear pain and sore throat.   Respiratory: Positive for cough. Negative for shortness of breath and wheezing.   Gastrointestinal: Negative for abdominal pain.  Neurological: Positive for headaches.     Vitals Pulse 99   Temp 97.6 F (36.4 C)   Resp 16   SpO2 97%   Objective:   Physical Exam Vitals reviewed.  HENT:     Right Ear: No tenderness. No mastoid tenderness. Tympanic membrane is erythematous.     Left Ear: Tympanic membrane normal.     Mouth/Throat:     Mouth:  Mucous membranes are moist.     Pharynx: No oropharyngeal exudate or posterior oropharyngeal erythema.  Cardiovascular:     Rate and Rhythm: Normal rate and regular rhythm.     Heart sounds: Normal heart sounds.  Pulmonary:     Effort: Pulmonary effort is normal.     Breath sounds: Normal breath sounds.  Skin:    General: Skin is warm and dry.  Neurological:     General: No focal deficit present.     Mental Status: He is alert.  Psychiatric:        Behavior: Behavior normal.      Assessment and Plan   1. Bronchitis - predniSONE (DELTASONE) 20 MG tablet; Take 1 tablet (20 mg total) by mouth daily with breakfast.  Dispense: 7 tablet; Refill: 0  2. Non-recurrent acute suppurative otitis media of right ear without spontaneous rupture of tympanic membrane - doxycycline (VIBRA-TABS) 100 MG tablet; Take 1 tablet (100 mg total) by mouth 2 (two) times daily.  Dispense: 20 tablet; Refill: 0  3. Cough - benzonatate (TESSALON) 100 MG capsule; Take 1 capsule (100 mg total) by mouth 2 (two) times daily as needed for cough.  Dispense: 20 capsule; Refill: 0 - Novel Coronavirus, NAA ([facility])    Bronchitis: Observed cough in exam room, will treat for bronchitis with prednisone daily for 7 days.  Also will send Tessalon Perles for symptom management.  Recommend supportive therapy,  Right otitis media:  will treat with antibiotic for 10 days.  Agrees with plan of care discussed today. Understands warning signs to seek further care: Chest pain, shortness of breath, any significant change in health. Understands to follow-up if symptoms do not improve, or worsen.  Will notify once COVID results are available.  Covid-19 warning:  Covid-19 is a virus that causes hypoxia (low oxygen level in blood) in some people. If you develop any changes in your usual breathing pattern: difficulty catching your breath, more short winded with activity or with resting, or anything that concerns you about your  breathing, do not hesitate to go to the emergency department immediately for evaluation. Covid infection can also affect the way the brain functions if it lacks oxygen, such as, feeling dizzy, passing out, or feeling confused, if you experience any of these symptoms, please do not delay to seek treatment.  Some people experience gastrointestinal problems with Covid, such as vomiting and diarrhea, dehydration is a serious risk and should be avoided. If you are unable to keep liquids down you may need to go to the emergency department for intravenous fluids to avoid dehydration.   Please alert and involve your family and/or friends to help keep an eye on you while you recover from Covid-19. If you have any questions or concerns about your recovery, please do not hesitate to call the office for guidance.   Recommend supportive therapy while you are recovering:   1) Get lots of rest.  2) Take over the counter pain medication if needed, such as acetaminophen or ibuprofen. Read and follow instructions on the label and make sure not to combine other medications that may have same ingredients in it. It is important to not take too much of these ingredients.  3) Drink plenty of caffeine-free fluids. (If you have heart or kidney problems, follow the instructions of your specialist regarding amounts).  4) If you are hungry, eat a bland diet, such as the BRAT diet (bananas, rice, applesauce, toast).  5) Let us know if you are not feeling better in a week.   Covid-19 Quarantine Instructions:   You have tested positive for Covid-19 infection. The current CDC guidelines for quarantine regardless of vaccination status are:    Please quarantine and isolate at home for 5 days.  - If you have no symptoms or your symptoms are resolving after 5 days you   can leave the home (resolving means no shortness of breath, no fever, no headache, etc). -Continue to wear a mask around others for an additional 5 days.   -If you have a fever or other symptoms continue to stay at home until   resolved.  Use over-the-counter medications for symptoms.If you develop respiratory issues/distress (see Covid warning), seek medical care in the Emergency Department.  If you must leave home or if you have to be around others please wear a mask. Please limit contact with immediate family members in the home, practice social distancing, frequent handwashing and clean hard surfaces touched frequently with household cleaning products. Members of your household will also need to quarantine for 5 days and test on day five if possible.You may also be contacted by the health department for follow up.     Dorena Bodo, FNP-C

## 2020-05-24 NOTE — Patient Instructions (Signed)
Otitis Media, Adult  Otitis media is a condition in which the middle ear is red and swollen (inflamed) and full of fluid. The middle ear is the part of the ear that contains bones for hearing as well as air that helps send sounds to the brain. The condition usually goes away on its own. What are the causes? This condition is caused by a blockage in the eustachian tube. The eustachian tube connects the middle ear to the back of the nose. It normally allows air into the middle ear. The blockage is caused by fluid or swelling. Problems that can cause blockage include:  A cold or infection that affects the nose, mouth, or throat.  Allergies.  An irritant, such as tobacco smoke.  Adenoids that have become large. The adenoids are soft tissue located in the back of the throat, behind the nose and the roof of the mouth.  Growth or swelling in the upper part of the throat, just behind the nose (nasopharynx).  Damage to the ear caused by change in pressure. This is called barotrauma. What are the signs or symptoms? Symptoms of this condition include:  Ear pain.  Fever.  Problems with hearing.  Being tired.  Fluid leaking from the ear.  Ringing in the ear. How is this treated? This condition can go away on its own within 3-5 days. But if the condition is caused by bacteria or does not go away on its own, or if it keeps coming back, your doctor may:  Give you antibiotic medicines.  Give you medicines for pain. Follow these instructions at home:  Take over-the-counter and prescription medicines only as told by your doctor.  If you were prescribed an antibiotic medicine, take it as told by your doctor. Do not stop taking the antibiotic even if you start to feel better.  Keep all follow-up visits as told by your doctor. This is important. Contact a doctor if:  You have bleeding from your nose.  There is a lump on your neck.  You are not feeling better in 5 days.  You feel worse  instead of better. Get help right away if:  You have pain that is not helped with medicine.  You have swelling, redness, or pain around your ear.  You get a stiff neck.  You cannot move part of your face (paralysis).  You notice that the bone behind your ear hurts when you touch it.  You get a very bad headache. Summary  Otitis media means that the middle ear is red, swollen, and full of fluid.  This condition usually goes away on its own.  If the problem does not go away, treatment may be needed. You may be given medicines to treat the infection or to treat your pain.  If you were prescribed an antibiotic medicine, take it as told by your doctor. Do not stop taking the antibiotic even if you start to feel better.  Keep all follow-up visits as told by your doctor. This is important. This information is not intended to replace advice given to you by your health care provider. Make sure you discuss any questions you have with your health care provider. Document Revised: 03/30/2019 Document Reviewed: 03/30/2019 Elsevier Patient Education  2021 [employer]  

## 2020-05-26 ENCOUNTER — Encounter: Payer: Self-pay | Admitting: Family Medicine

## 2020-05-26 LAB — SARS-COV-2, NAA 2 DAY TAT

## 2020-05-26 LAB — SPECIMEN STATUS REPORT

## 2020-05-26 LAB — NOVEL CORONAVIRUS, NAA: SARS-CoV-2, NAA: NOT DETECTED

## 2020-10-11 DIAGNOSIS — Z029 Encounter for administrative examinations, unspecified: Secondary | ICD-10-CM

## 2022-03-17 ENCOUNTER — Ambulatory Visit: Payer: BC Managed Care – PPO | Admitting: Family Medicine

## 2022-03-17 ENCOUNTER — Ambulatory Visit
Admission: EM | Admit: 2022-03-17 | Discharge: 2022-03-17 | Disposition: A | Discharge status: Home / Self Care | Payer: BC Managed Care – PPO

## 2022-03-17 DIAGNOSIS — R0982 Postnasal drip: Secondary | ICD-10-CM | POA: Diagnosis not present

## 2022-03-17 DIAGNOSIS — Z8616 Personal history of COVID-19: Secondary | ICD-10-CM | POA: Diagnosis not present

## 2022-03-17 DIAGNOSIS — R059 Cough, unspecified: Secondary | ICD-10-CM

## 2022-03-17 MED ORDER — FLUTICASONE PROPIONATE 50 MCG/ACT NA SUSP: 2.0000 | Freq: Every day | NASAL | 0 refills | Status: AC

## 2022-03-17 MED ORDER — PSEUDOEPH-BROMPHEN-DM 30-2-10 MG/5ML PO SYRP: 5.0000 mL | ORAL_SOLUTION | Freq: Four times a day (QID) | ORAL | 0 refills | Status: AC | PRN

## 2022-03-17 MED ORDER — CETIRIZINE HCL 10 MG PO TABS: 10.0000 mg | ORAL_TABLET | Freq: Every day | ORAL | 0 refills | Status: AC

## 2022-03-17 NOTE — ED Triage Notes (Signed)
Pt reports cough, nasal congestion and chets congestion x 3 days. Alka Seltzer gives some relief.  Pt reports he had COVID 2 weeks ago.

## 2022-03-17 NOTE — Discharge Instructions (Addendum)
Take medication as prescribed. Increase fluids and allow for plenty of rest. Recommend Tylenol or ibuprofen as needed for pain, fever, or general discomfort. Recommend using a humidifier at bedtime during sleep to help with cough and nasal congestion. Sleep elevated on 2 pillows while cough symptoms persist. Follow-up if your symptoms do not improve over the next 10 to 14 days or sooner if they worsen.

## 2022-03-17 NOTE — ED Provider Notes (Signed)
[facility]    CSN: [id number] Arrival date & time: 03/17/22  3235      History   Chief Complaint No chief complaint on file.   HPI Cory Arnold is a 46 y.o. male.   The history is provided by the patient.   Patient presents with a 1 day history of cough, nasal congestion, and postnasal drainage.  Patient states that the cough has been productive.  States that he coughed up "blood" earlier today, he wonders if it was from the postnasal drainage.  He states that he did have COVID 2 weeks ago.  He states that his COVID symptoms seem to improve.  He denies fever, chills, ear pain, wheezing, shortness of breath, difficulty breathing.  He states that he took Alka-Seltzer for his symptoms.  History reviewed. No pertinent past medical history.  Patient Active Problem List   Diagnosis Date Noted   Bronchitis 05/24/2020   Non-recurrent acute suppurative otitis media of right ear without spontaneous rupture of tympanic membrane 05/24/2020   Cough 05/24/2020    History reviewed. No pertinent surgical history.     Home Medications    Prior to Admission medications   Medication Sig Start Date End Date Taking? Authorizing Provider  brompheniramine-pseudoephedrine-DM 30-2-10 MG/5ML syrup Take 5 mLs by mouth 4 (four) times daily as needed. 03/17/22  Yes Aidon Klemens-Warren, Alda Lea, NP  cetirizine (ZYRTEC) 10 MG tablet Take 1 tablet (10 mg total) by mouth daily. 03/17/22  Yes Seydina Holliman-Warren, Alda Lea, NP  fluticasone (FLONASE) 50 MCG/ACT nasal spray Place 2 sprays into both nostrils daily. 03/17/22  Yes Shawnita Krizek-Warren, Alda Lea, NP  Phenyleph-CPM-DM-APAP (ALKA-SELTZER PLUS COLD & COUGH PO) Take by mouth.   Yes [provider]  Sod Bicarb-K Bicarb-Citric Acd (ALKA-SELTZER GOLD PO) Take by mouth.   Yes [provider]  benzonatate (TESSALON) 100 MG capsule Take 1 capsule (100 mg total) by mouth 2 (two) times daily as needed for cough. 05/24/20   Chalmers Guest, FNP   doxycycline (VIBRA-TABS) 100 MG tablet Take 1 tablet (100 mg total) by mouth 2 (two) times daily. 05/24/20   Chalmers Guest, FNP  predniSONE (DELTASONE) 20 MG tablet Take 1 tablet (20 mg total) by mouth daily with breakfast. 05/24/20   Chalmers Guest, FNP    Family History History reviewed. No pertinent family history.  Social History Social History   Tobacco Use   Smoking status: Never   Smokeless tobacco: Never  Substance Use Topics   Alcohol use: Never   Drug use: Never     Allergies   Erythromycin and Penicillins   Review of Systems Review of Systems Per HPI  Physical Exam Triage Vital Signs ED Triage Vitals  Enc Vitals Group     BP 03/17/22 0946 131/86     Pulse Rate 03/17/22 0946 91     Resp 03/17/22 0946 17     Temp 03/17/22 0946 98 F (36.7 C)     Temp Source 03/17/22 0946 Oral     SpO2 03/17/22 0946 98 %     Weight --      Height --      Head Circumference --      Peak Flow --      Pain Score 03/17/22 0948 0     Pain Loc --      Pain Edu? --      Excl. in [place]? --    No data found.  Updated Vital Signs BP 131/86 (BP Location:  Right Arm)   Pulse 91   Temp 98 F (36.7 C) (Oral)   Resp 17   SpO2 98%   Visual Acuity Right Eye Distance:   Left Eye Distance:   Bilateral Distance:    Right Eye Near:   Left Eye Near:    Bilateral Near:     Physical Exam Vitals and nursing note reviewed.  Constitutional:      General: He is not in acute distress.    Appearance: Normal appearance.  HENT:     Head: Normocephalic.     Right Ear: Tympanic membrane, ear canal and external ear normal.     Left Ear: Tympanic membrane, ear canal and external ear normal.     Nose:     Right Turbinates: Enlarged and swollen.     Left Turbinates: Enlarged and swollen.     Right Sinus: No maxillary sinus tenderness or frontal sinus tenderness.     Left Sinus: No maxillary sinus tenderness or frontal sinus tenderness.     Mouth/Throat:     Lips: Pink.     Pharynx:  Uvula midline. Posterior oropharyngeal erythema present. No pharyngeal swelling, oropharyngeal exudate or uvula swelling.  Eyes:     Extraocular Movements: Extraocular movements intact.     Conjunctiva/sclera: Conjunctivae normal.     Pupils: Pupils are equal, round, and reactive to light.  Cardiovascular:     Rate and Rhythm: Normal rate and regular rhythm.     Pulses: Normal pulses.     Heart sounds: Normal heart sounds.  Pulmonary:     Effort: Pulmonary effort is normal. No respiratory distress.     Breath sounds: Normal breath sounds. No stridor. No wheezing, rhonchi or rales.  Abdominal:     General: Bowel sounds are normal.     Palpations: Abdomen is soft.  Musculoskeletal:     Cervical back: Normal range of motion.  Lymphadenopathy:     Cervical: No cervical adenopathy.  Skin:    General: Skin is warm and dry.  Neurological:     General: No focal deficit present.     Mental Status: He is alert and oriented to person, place, and time.  Psychiatric:        Mood and Affect: Mood normal.        Behavior: Behavior normal.      UC Treatments / Results  Labs (all labs ordered are listed, but only abnormal results are displayed) Labs Reviewed - No data to display  EKG   Radiology No results found.  Procedures Procedures (including critical care time)  Medications Ordered in UC Medications - No data to display  Initial Impression / Assessment and Plan / UC Course  I have reviewed the triage vital signs and the nursing notes.  Pertinent labs & imaging results that were available during my care of the patient were reviewed by me and considered in my medical decision making (see chart for details).  Patient presents for complaints of cough, nasal congestion, and postnasal drainage.  Symptoms have been present for the past 24 hours.  Differential diagnoses include allergic rhinitis versus viral upper respiratory infection.  Given his recent history of COVID, it is  difficult to determine if his symptoms are related.  In the interim, will treat patient with Bromfed cough syrup, cetirizine 10 mg, and fluticasone nasal spray.  Supportive care recommendations were provided to the patient along with strict return precautions.  Patient verbalizes understanding.  All questions were answered.  Patient is stable  for discharge. Final Clinical Impressions(s) / UC Diagnoses   Final diagnoses:  History of COVID-19  Postnasal drip  Cough, unspecified type     Discharge Instructions      Take medication as prescribed. Increase fluids and allow for plenty of rest. Recommend Tylenol or ibuprofen as needed for pain, fever, or general discomfort. Recommend using a humidifier at bedtime during sleep to help with cough and nasal congestion. Sleep elevated on 2 pillows while cough symptoms persist. Follow-up if your symptoms do not improve over the next 10 to 14 days or sooner if they worsen.      ED Prescriptions     Medication Sig Dispense Auth. Provider   brompheniramine-pseudoephedrine-DM 30-2-10 MG/5ML syrup Take 5 mLs by mouth 4 (four) times daily as needed. 140 mL Leightyn Cina-Warren, Sadie Haber, NP   cetirizine (ZYRTEC) 10 MG tablet Take 1 tablet (10 mg total) by mouth daily. 30 tablet Gordie Belvin-Warren, Sadie Haber, NP   fluticasone (FLONASE) 50 MCG/ACT nasal spray Place 2 sprays into both nostrils daily. 16 g Imojean Yoshino-Warren, Sadie Haber, NP      PDMP not reviewed this encounter.   Abran Cantor, NP 03/17/22 1035

## 2022-03-20 ENCOUNTER — Ambulatory Visit
Admission: EM | Admit: 2022-03-20 | Discharge: 2022-03-20 | Disposition: A | Discharge status: Home / Self Care | Payer: BC Managed Care – PPO | Attending: Nurse Practitioner | Admitting: Nurse Practitioner

## 2022-03-20 DIAGNOSIS — B9689 Other specified bacterial agents as the cause of diseases classified elsewhere: Secondary | ICD-10-CM | POA: Diagnosis not present

## 2022-03-20 DIAGNOSIS — J329 Chronic sinusitis, unspecified: Secondary | ICD-10-CM

## 2022-03-20 MED ORDER — DOXYCYCLINE HYCLATE 100 MG PO CAPS: 100.0000 mg | ORAL_CAPSULE | Freq: Two times a day (BID) | ORAL | 0 refills | Status: AC

## 2022-03-20 MED ORDER — BENZONATATE 100 MG PO CAPS: 100.0000 mg | ORAL_CAPSULE | Freq: Three times a day (TID) | ORAL | 0 refills | Status: AC | PRN

## 2022-03-20 NOTE — Discharge Instructions (Signed)
You most likely have a sinus infection.  Please start the doxycycline to treat this.  Take the cough medicine every 8 hours as needed for a dry cough.  You can take Mucinex 600 mg twice daily.  Increase water.

## 2022-03-20 NOTE — ED Triage Notes (Signed)
Pt reports cough x 8 days. Reports he was seeing for this complaint 3 days ago.

## 2022-03-20 NOTE — ED Provider Notes (Signed)
[facility]    CSN: [id number] Arrival date & time: 03/20/22  1624      History   Chief Complaint Chief Complaint  Patient presents with   Cough    HPI Cory Arnold is a 46 y.o. male.   Patient presents for 3 weeks of illness, recently worsening past couple of days.  He denies fever, chills, bodies, shortness of breath or chest pain, chest tightness, chest congestion, sneezing, sinus pressure, ear pain, abdominal pain, nausea/vomiting, diarrhea, loss of taste or smell, or new rash.  He endorses a dry cough at nighttime and a little bit of productive cough during the day, nasal congestion, runny nose, postnasal drainage, sore throat, swollen glands in his neck, headache that is improved after Tylenol, decreased appetite the past couple days, fatigue, and tooth pain on the right side of his upper jaw.  He has taken Bromphed Flonase, and cetirizine as previously recommended without any improvement.    History reviewed. No pertinent past medical history.  Patient Active Problem List   Diagnosis Date Noted   Bronchitis 05/24/2020   Non-recurrent acute suppurative otitis media of right ear without spontaneous rupture of tympanic membrane 05/24/2020   Cough 05/24/2020    History reviewed. No pertinent surgical history.     Home Medications    Prior to Admission medications   Medication Sig Start Date End Date Taking? Authorizing Provider  benzonatate (TESSALON) 100 MG capsule Take 1 capsule (100 mg total) by mouth 3 (three) times daily as needed for cough. Do not take with alcohol or while driving or operating heavy machinery.  May cause drowsiness. 03/20/22  Yes Valentino Nose, NP  doxycycline (VIBRAMYCIN) 100 MG capsule Take 1 capsule (100 mg total) by mouth 2 (two) times daily for 7 days. 03/20/22 03/27/22 Yes Valentino Nose, NP  brompheniramine-pseudoephedrine-DM 30-2-10 MG/5ML syrup Take 5 mLs by mouth 4 (four) times daily as needed. 03/17/22    Leath-Warren, Sadie Haber, NP  cetirizine (ZYRTEC) 10 MG tablet Take 1 tablet (10 mg total) by mouth daily. 03/17/22   Leath-Warren, Sadie Haber, NP  fluticasone (FLONASE) 50 MCG/ACT nasal spray Place 2 sprays into both nostrils daily. 03/17/22   Leath-Warren, Sadie Haber, NP  Phenyleph-CPM-DM-APAP (ALKA-SELTZER PLUS COLD & COUGH PO) Take by mouth.    [provider]  Sod Bicarb-K Bicarb-Citric Acd (ALKA-SELTZER GOLD PO) Take by mouth.    [provider]    Family History History reviewed. No pertinent family history.  Social History Social History   Tobacco Use   Smoking status: Never   Smokeless tobacco: Never  Substance Use Topics   Alcohol use: Never   Drug use: Never     Allergies   Erythromycin and Penicillins   Review of Systems Review of Systems Per HPI  Physical Exam Triage Vital Signs ED Triage Vitals  Enc Vitals Group     BP 03/20/22 1633 (!) 151/78     Pulse Rate 03/20/22 1633 89     Resp 03/20/22 1633 18     Temp 03/20/22 1633 97.9 F (36.6 C)     Temp Source 03/20/22 1633 Oral     SpO2 03/20/22 1633 98 %     Weight --      Height --      Head Circumference --      Peak Flow --      Pain Score 03/20/22 1632 0     Pain Loc --      Pain Edu? --  Excl. in GC? --    No data found.  Updated Vital Signs BP (!) 151/78 (BP Location: Right Arm)   Pulse 89   Temp 97.9 F (36.6 C) (Oral)   Resp 18   SpO2 98%   Visual Acuity Right Eye Distance:   Left Eye Distance:   Bilateral Distance:    Right Eye Near:   Left Eye Near:    Bilateral Near:     Physical Exam Vitals and nursing note reviewed.  Constitutional:      General: He is not in acute distress.    Appearance: Normal appearance. He is not ill-appearing or toxic-appearing.  HENT:     Head: Normocephalic and atraumatic.     Right Ear: Tympanic membrane, ear canal and external ear normal.     Left Ear: Tympanic membrane, ear canal and external ear normal.     Nose:  Congestion and rhinorrhea present.     Right Sinus: Maxillary sinus tenderness present. No frontal sinus tenderness.     Left Sinus: No maxillary sinus tenderness or frontal sinus tenderness.     Mouth/Throat:     Mouth: Mucous membranes are moist.     Pharynx: Oropharynx is clear. Posterior oropharyngeal erythema present. No oropharyngeal exudate.  Eyes:     General: No scleral icterus.    Extraocular Movements: Extraocular movements intact.  Cardiovascular:     Rate and Rhythm: Normal rate and regular rhythm.  Pulmonary:     Effort: Pulmonary effort is normal. No respiratory distress.     Breath sounds: Normal breath sounds. No wheezing, rhonchi or rales.  Abdominal:     General: Abdomen is flat. Bowel sounds are normal. There is no distension.     Palpations: Abdomen is soft.     Tenderness: There is no abdominal tenderness. There is no guarding.  Musculoskeletal:     Cervical back: Normal range of motion and neck supple.  Lymphadenopathy:     Cervical: Cervical adenopathy present.  Skin:    General: Skin is warm and dry.     Coloration: Skin is not jaundiced or pale.     Findings: No erythema or rash.  Neurological:     Mental Status: He is alert and oriented to person, place, and time.  Psychiatric:        Behavior: Behavior is cooperative.      UC Treatments / Results  Labs (all labs ordered are listed, but only abnormal results are displayed) Labs Reviewed - No data to display  EKG   Radiology No results found.  Procedures Procedures (including critical care time)  Medications Ordered in UC Medications - No data to display  Initial Impression / Assessment and Plan / UC Course  I have reviewed the triage vital signs and the nursing notes.  Pertinent labs & imaging results that were available during my care of the patient were reviewed by me and considered in my medical decision making (see chart for details).   Patient is well-appearing, afebrile, not  tachycardic, not tachypneic, oxygenating well on room air.  He is mildly hypertensive today, likely secondary to acute illness and taking nasal decongestant.  Bacterial sinusitis Treat with doxycycline twice daily for 7 days Supportive care discussed Start Mucinex Encourage plenty of hydration ER, return precautions discussed  The patient was given the opportunity to ask questions.  All questions answered to their satisfaction.  The patient is in agreement to this plan.   Final Clinical Impressions(s) / UC Diagnoses  Final diagnoses:  Bacterial sinusitis     Discharge Instructions      You most likely have a sinus infection.  Please start the doxycycline to treat this.  Take the cough medicine every 8 hours as needed for a dry cough.  You can take Mucinex 600 mg twice daily.  Increase water.       ED Prescriptions     Medication Sig Dispense Auth. Provider   doxycycline (VIBRAMYCIN) 100 MG capsule Take 1 capsule (100 mg total) by mouth 2 (two) times daily for 7 days. 14 capsule Cathlean Marseilles A, NP   benzonatate (TESSALON) 100 MG capsule Take 1 capsule (100 mg total) by mouth 3 (three) times daily as needed for cough. Do not take with alcohol or while driving or operating heavy machinery.  May cause drowsiness. 21 capsule Valentino Nose, NP      PDMP not reviewed this encounter.   Valentino Nose, NP 03/20/22 [phone]

## 2023-02-15 ENCOUNTER — Encounter: Payer: Self-pay | Admitting: *Deleted

## 2023-04-30 ENCOUNTER — Ambulatory Visit (HOSPITAL_COMMUNITY)
Admission: RE | Admit: 2023-04-30 | Discharge: 2023-04-30 | Disposition: A | Discharge status: Home / Self Care | Payer: BC Managed Care – PPO | Source: Ambulatory Visit | Attending: Family Medicine | Admitting: Family Medicine

## 2023-04-30 ENCOUNTER — Other Ambulatory Visit (HOSPITAL_COMMUNITY): Payer: Self-pay | Admitting: Family Medicine

## 2023-04-30 DIAGNOSIS — R0789 Other chest pain: Secondary | ICD-10-CM

## 2023-08-17 ENCOUNTER — Encounter: Payer: Self-pay | Admitting: *Deleted
